# Patient Record
Sex: Female | Born: 1935 | Race: White | Hispanic: No | Marital: Single | Smoking: Never smoker
Health system: Southern US, Community
[De-identification: ages and names within clinical notes are randomized; demographics above are authoritative.]

## PROBLEM LIST (undated history)

## (undated) DIAGNOSIS — J449 Chronic obstructive pulmonary disease, unspecified: Secondary | ICD-10-CM

## (undated) DIAGNOSIS — I4891 Unspecified atrial fibrillation: Secondary | ICD-10-CM

## (undated) HISTORY — PX: REVISION TOTAL HIP ARTHROPLASTY: SHX766

## (undated) HISTORY — PX: REPLACEMENT TOTAL KNEE: SUR1224

---
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
## 1997-09-27 ENCOUNTER — Encounter: Admission: RE | Admit: 1997-09-27 | Discharge: 1997-12-26 | Payer: Self-pay | Admitting: Neurological Surgery

## 1999-04-12 ENCOUNTER — Other Ambulatory Visit: Admission: RE | Admit: 1999-04-12 | Discharge: 1999-04-12 | Payer: Self-pay | Admitting: Obstetrics & Gynecology

## 1999-04-30 ENCOUNTER — Other Ambulatory Visit: Admission: RE | Admit: 1999-04-30 | Discharge: 1999-04-30 | Payer: Self-pay | Admitting: Obstetrics & Gynecology

## 2000-05-05 ENCOUNTER — Encounter: Admission: RE | Admit: 2000-05-05 | Discharge: 2000-05-23 | Payer: Self-pay | Admitting: Family Medicine

## 2001-05-05 ENCOUNTER — Other Ambulatory Visit: Admission: RE | Admit: 2001-05-05 | Discharge: 2001-05-05 | Payer: Self-pay | Admitting: Obstetrics & Gynecology

## 2005-03-28 ENCOUNTER — Other Ambulatory Visit: Admission: RE | Admit: 2005-03-28 | Discharge: 2005-03-28 | Payer: Self-pay | Admitting: Obstetrics & Gynecology

## 2020-02-17 ENCOUNTER — Emergency Department (HOSPITAL_COMMUNITY)
Admission: EM | Admit: 2020-02-17 | Discharge: 2020-02-17 | Disposition: A | Discharge status: Home / Self Care | Payer: Medicare Other | Attending: Emergency Medicine | Admitting: Emergency Medicine

## 2020-02-17 ENCOUNTER — Emergency Department (HOSPITAL_COMMUNITY): Payer: Medicare Other

## 2020-02-17 ENCOUNTER — Encounter (HOSPITAL_COMMUNITY): Payer: Self-pay | Admitting: Emergency Medicine

## 2020-02-17 ENCOUNTER — Other Ambulatory Visit: Payer: Self-pay

## 2020-02-17 DIAGNOSIS — W19XXXA Unspecified fall, initial encounter: Secondary | ICD-10-CM

## 2020-02-17 DIAGNOSIS — Y92009 Unspecified place in unspecified non-institutional (private) residence as the place of occurrence of the external cause: Secondary | ICD-10-CM | POA: Diagnosis not present

## 2020-02-17 DIAGNOSIS — J449 Chronic obstructive pulmonary disease, unspecified: Secondary | ICD-10-CM | POA: Insufficient documentation

## 2020-02-17 DIAGNOSIS — S51812A Laceration without foreign body of left forearm, initial encounter: Secondary | ICD-10-CM | POA: Diagnosis not present

## 2020-02-17 DIAGNOSIS — W0110XA Fall on same level from slipping, tripping and stumbling with subsequent striking against unspecified object, initial encounter: Secondary | ICD-10-CM | POA: Diagnosis not present

## 2020-02-17 DIAGNOSIS — S0181XA Laceration without foreign body of other part of head, initial encounter: Secondary | ICD-10-CM | POA: Insufficient documentation

## 2020-02-17 DIAGNOSIS — R0789 Other chest pain: Secondary | ICD-10-CM | POA: Insufficient documentation

## 2020-02-17 DIAGNOSIS — S0993XA Unspecified injury of face, initial encounter: Secondary | ICD-10-CM | POA: Diagnosis present

## 2020-02-17 HISTORY — DX: Chronic obstructive pulmonary disease, unspecified: J44.9

## 2020-02-17 HISTORY — DX: Unspecified atrial fibrillation: I48.91

## 2020-02-17 LAB — COMPREHENSIVE METABOLIC PANEL
ALT: 16 U/L (ref 0–44)
AST: 26 U/L (ref 15–41)
Albumin: 3.4 g/dL — ABNORMAL LOW (ref 3.5–5.0)
Alkaline Phosphatase: 71 U/L (ref 38–126)
Anion gap: 12 (ref 5–15)
BUN: 35 mg/dL — ABNORMAL HIGH (ref 8–23)
CO2: 21 mmol/L — ABNORMAL LOW (ref 22–32)
Calcium: 9.2 mg/dL (ref 8.9–10.3)
Chloride: 99 mmol/L (ref 98–111)
Creatinine, Ser: 1.39 mg/dL — ABNORMAL HIGH (ref 0.44–1.00)
GFR, Estimated: 26 mL/min — ABNORMAL LOW (ref >60)
Glucose, Bld: 143 mg/dL — ABNORMAL HIGH (ref 70–99)
Potassium: 4.1 mmol/L (ref 3.5–5.1)
Sodium: 132 mmol/L — ABNORMAL LOW (ref 135–145)
Total Bilirubin: 0.6 mg/dL (ref 0.3–1.2)
Total Protein: 6.5 g/dL (ref 6.5–8.1)

## 2020-02-17 LAB — CBC
HCT: 38.5 % (ref 36.0–46.0)
Hemoglobin: 12.6 g/dL (ref 12.0–15.0)
MCH: 33.1 pg (ref 26.0–34.0)
MCHC: 32.7 g/dL (ref 30.0–36.0)
MCV: 101 fL — ABNORMAL HIGH (ref 80.0–100.0)
Platelets: 260 10*3/uL (ref 150–400)
RBC: 3.81 MIL/uL — ABNORMAL LOW (ref 3.87–5.11)
RDW: 13.2 % (ref 11.5–15.5)
WBC: 8 10*3/uL (ref 4.0–10.5)
nRBC: 0 % (ref 0.0–0.2)

## 2020-02-17 LAB — PROTIME-INR
INR: 2.4 — ABNORMAL HIGH (ref 0.8–1.2)
Prothrombin Time: 25.7 seconds — ABNORMAL HIGH (ref 11.4–15.2)

## 2020-02-17 MED ORDER — LIDOCAINE-EPINEPHRINE 1 %-1:100000 IJ SOLN
10.0000 mL | Freq: Once | INTRAMUSCULAR | Status: DC
  Filled 2020-02-17: qty 1

## 2020-02-17 MED ORDER — FENTANYL CITRATE (PF) 100 MCG/2ML IJ SOLN: 25.0000 ug | Freq: Once | INTRAMUSCULAR | Status: DC

## 2020-02-17 MED ORDER — TETANUS-DIPHTH-ACELL PERTUSSIS 5-2.5-18.5 LF-MCG/0.5 IM SUSY: 0.5000 mL | PREFILLED_SYRINGE | Freq: Once | INTRAMUSCULAR | Status: DC

## 2020-02-17 MED ORDER — LIDOCAINE-EPINEPHRINE-TETRACAINE (LET) TOPICAL GEL
3.0000 mL | Freq: Once | TOPICAL | Status: DC
  Filled 2020-02-17: qty 3

## 2020-02-17 NOTE — ED Notes (Signed)
Pt to CT at this time.  Pt remains alert and oriented x's 3

## 2020-02-17 NOTE — Progress Notes (Signed)
   02/17/20 1912  Clinical Encounter Type  Visited With Patient not available  Visit Type Trauma  Referral From Nurse  Consult/Referral To Chaplain   Chaplain responded to Level 2 trauma. Pt being treated. No current need for chaplain. Chaplain remains available as needed.  This note was prepared by Chaplain Resident, Tacy Learn, MDiv. Chaplain remains available as needed through the on-call pager: [phone].

## 2020-02-17 NOTE — Progress Notes (Signed)
Orthopedic Tech Progress Note Patient Details:  Elizabeth Wise 03/18/1873 [id number] Level 2 trauma Patient ID: Melvia Heaps Doe, female   DOB: 03/18/1873, 86 y.o.   MRN: [id number]   Michelle Piper 02/17/2020, 7:33 PM

## 2020-02-17 NOTE — ED Notes (Signed)
Pt remains in CT at this time.  

## 2020-02-17 NOTE — ED Provider Notes (Signed)
[facility] EMERGENCY DEPARTMENT Provider Note   CSN: [id number] Arrival date & time: 02/17/20  1929     History CC: fall  Elizabeth Wise is a 86 y.o. female with history of A. fib presents after a ground-level fall at home.  Per patient, she tripped over something on the floor and fell striking her head on a table.  She states she is on warfarin for A. fib.  No LOC.  Has anterior left chest wall pain and right index finger pain.  GCS 15, ABCs intact.  The history is provided by the patient and the EMS personnel.  Trauma Mechanism of injury: fall Injury location: face Injury location detail: face Incident location: home Time since incident: Just prior to arrival. Arrived directly from scene: yes   Fall:      Fall occurred: Tripped over furniture.      Height of fall: Ground-level      Point of impact: head      Entrapped after fall: no  EMS/PTA data:      Loss of consciousness: no      Amnesic to event: no  Current symptoms:      Associated symptoms:            Reports chest pain (Left chest wall pain).            Denies abdominal pain, back pain, difficulty breathing, headache, loss of consciousness, nausea, neck pain and seizures.   Relevant PMH:      Medical risk factors:            AFib      Pharmacological risk factors:            Anticoagulation therapy.       Tetanus status: UTD      Past Medical History:  Diagnosis Date  . Atrial fibrillation (HCC)   . COPD (chronic obstructive pulmonary disease) (HCC)     There are no problems to display for this patient.   Past Surgical History:  Procedure Laterality Date  . REPLACEMENT TOTAL KNEE    . REVISION TOTAL HIP ARTHROPLASTY       OB History   No obstetric history on file.     No family history on file.  Social History   Tobacco Use  . Smoking status: Never Smoker  . Smokeless tobacco: Never Used  Substance Use Topics  . Alcohol use: Yes    Comment: Wine  . Drug  use: Never    Home Medications Prior to Admission medications   Not on File    Allergies    Patient has no allergy information on record.  Review of Systems   Review of Systems  Cardiovascular: Positive for chest pain (Left chest wall pain).  Gastrointestinal: Negative for abdominal pain and nausea.  Musculoskeletal: Negative for back pain and neck pain.  Neurological: Negative for seizures, loss of consciousness and headaches.  All other systems reviewed and are negative.   Physical Exam Updated Vital Signs BP (!) 144/93   Pulse 76   Temp 98 F (36.7 C) (Temporal)   Resp 15   Ht 5\' 2"  (1.575 m)   Wt 53.5 kg   LMP  (Exact Date)   SpO2 99%   BMI 21.58 kg/m     Physical Exam  Constitutional  Awake, alert  Head   4 cm linear hemostatic right forehead laceration  No skull depressions or lacerations  ENT  PERRL, 12mm  No conjunctival  hemorrhage  No periorbital ecchymoses, [place] Eyes, or Battle Sign bilaterally  Ears atraumatic  No nasal septal deviation or hematoma  Mouth and tongue atraumatic  Trachea midline   Neck  No C collar in place  Neck supple  Chest  Clavicles atraumatic  Clavicles stable to anterior compression without crepitus  Left anterior chest wall point tenderness around rib 10-12  Chest wall with symmetric expansion  Chest wall stable to anterior and lateral compression without crepitus  Respiratory  Effort normal  CTAB  No respiratory distress  CV   tachycardic, irregular to the 110s  DP and radial pulses 2+ and equal bilaterally  Abdomen  Soft  Non-tender  Non-distended  No peritonitis  No abrasions/contusions  MSK  Swelling and tenderness of right index finger with difficulty with flexion  No obvious deformity  ROM appropriate  Pelvis stable to lateral compression  Skin  Warm  Dry  Skin tears to left forearm x2  Neuro  Moving all extremities  GCS 15    ED Results / Procedures / Treatments    Labs (all labs ordered are listed, but only abnormal results are displayed) Labs Reviewed  COMPREHENSIVE METABOLIC PANEL - Abnormal; Notable for the following components:      Result Value   Sodium 132 (*)    CO2 21 (*)    Glucose, Bld 143 (*)    BUN 35 (*)    Creatinine, Ser 1.39 (*)    Albumin 3.4 (*)    GFR, Estimated 26 (*)    All other components within normal limits  CBC - Abnormal; Notable for the following components:   RBC 3.81 (*)    MCV 101.0 (*)    All other components within normal limits  PROTIME-INR - Abnormal; Notable for the following components:   Prothrombin Time 25.7 (*)    INR 2.4 (*)    All other components within normal limits  SAMPLE TO BLOOD BANK    EKG EKG Interpretation  Date/Time:  Thursday February 17 2020 19:40:15 EST Ventricular Rate:  125 PR Interval:    QRS Duration: 91 QT Interval:  339 QTC Calculation: 489 R Axis:   -78 Text Interpretation: Atrial fibrillation Left axis deviation Anteroseptal infarct, age indeterminate ST elevation, consider inferior injury Confirmed by Virgina Norfolk [phone]) on 02/17/2020 8:10:36 PM   Radiology DG Ribs Unilateral Left  Result Date: 02/17/2020 CLINICAL DATA:  Left rib pain after fall EXAM: LEFT RIBS - 2 VIEW COMPARISON:  02/17/2020 FINDINGS: Frontal and oblique views of the left thoracic cage are obtained. There are no acute displaced fractures. Left chest is clear. No effusion or pneumothorax. IMPRESSION: 1. No acute bony abnormalities. Electronically Signed   By: Sharlet Salina M.D.   On: 02/17/2020 21:04   CT HEAD WO CONTRAST  Result Date: 02/17/2020 CLINICAL DATA:  Larey Seat, anticoagulated, neck pain EXAM: CT HEAD WITHOUT CONTRAST TECHNIQUE: Contiguous axial images were obtained from the base of the skull through the vertex without intravenous contrast. COMPARISON:  None. FINDINGS: Brain: Hypodensities within the inferior right frontal lobe, bilateral periventricular white matter, and left basal ganglia  consistent with chronic ischemic changes. No signs of acute infarct or hemorrhage. Lateral ventricles and remaining midline structures are unremarkable. No acute extra-axial fluid collections. No mass effect. Vascular: Atherosclerosis throughout the internal carotid arteries. No hyperdense vessel. Skull: Normal. Negative for fracture or focal lesion. Laceration right frontal scalp. Sinuses/Orbits: Polypoid mucosal thickening right maxillary sinus. Remaining sinuses are clear. Other: None. IMPRESSION: 1. Chronic ischemic  changes as above. No acute intracranial process. Electronically Signed   By: Sharlet Salina M.D.   On: 02/17/2020 20:18   CT CERVICAL SPINE WO CONTRAST  Result Date: 02/17/2020 CLINICAL DATA:  Larey Seat, neck pain EXAM: CT CERVICAL SPINE WITHOUT CONTRAST TECHNIQUE: Multidetector CT imaging of the cervical spine was performed without intravenous contrast. Multiplanar CT image reconstructions were also generated. COMPARISON:  None. FINDINGS: Alignment: There is grade 1 anterolisthesis of C4 on C5. Mild retrolisthesis of C6 relative to C7. These changes are both likely related to congenital C5/C6 fusion and associated spondylosis. Skull base and vertebrae: No acute fracture. No primary bone lesion or focal pathologic process. Soft tissues and spinal canal: No prevertebral fluid or swelling. No visible canal hematoma. Disc levels: Severe hypertrophic changes are seen at the C1/C2 interface, eccentric to the right. There is bony fusion at C5/C6. Significant spondylosis is seen at the adjacent levels of C4/C5 and C6/C7. There is diffuse facet hypertrophy at all levels. Significant bilateral neural foraminal encroachment is seen at C3-4, C4-5, and C6-7. Upper chest: Airway is patent.  Lung apices are clear. Other: Reconstructed images demonstrate no additional findings. IMPRESSION: 1. No acute cervical spine fracture. 2. Extensive multilevel cervical degenerative changes. Electronically Signed   By: Sharlet Salina M.D.   On: 02/17/2020 20:24   DG Chest Portable 1 View  Result Date: 02/17/2020 CLINICAL DATA:  Larey Seat, anticoagulated EXAM: PORTABLE CHEST 1 VIEW COMPARISON:  None. FINDINGS: Single frontal view of the chest demonstrates an unremarkable cardiac silhouette. No airspace disease, effusion, or pneumothorax. No acute bony abnormalities. Severe bilateral shoulder osteoarthritis. IMPRESSION: 1. No acute intrathoracic process. Electronically Signed   By: Sharlet Salina M.D.   On: 02/17/2020 19:56   DG Hand Complete Right  Result Date: 02/17/2020 CLINICAL DATA:  Larey Seat, swelling, anticoagulated EXAM: RIGHT HAND - COMPLETE 3+ VIEW COMPARISON:  None. FINDINGS: Frontal, oblique, and lateral views of the right hand are obtained. No acute displaced fractures. There is diffuse osteoarthritis throughout the metacarpophalangeal and interphalangeal joints. Slight subluxation at the first metacarpophalangeal joint. Dorsal subluxation of the ulna at the distal radioulnar joint, age indeterminate. Soft tissues are unremarkable. IMPRESSION: 1. Severe dorsal subluxation of the ulna at the distal radioulnar joint, of uncertain acuity. No associated fracture. 2. Diffuse osteoarthritis. Electronically Signed   By: Sharlet Salina M.D.   On: 02/17/2020 19:59    Procedures Procedures (including critical care time)  Medications Ordered in ED Medications  fentaNYL (SUBLIMAZE) injection 25 mcg (has no administration in time range)  Tdap (BOOSTRIX) injection 0.5 mL (has no administration in time range)  lidocaine-EPINEPHrine-tetracaine (LET) topical gel (has no administration in time range)  lidocaine-EPINEPHrine (XYLOCAINE W/EPI) 1 %-1:100000 (with pres) injection 10 mL (has no administration in time range)    ED Course  I have reviewed the triage vital signs and the nursing notes.  Pertinent labs & imaging results that were available during my care of the patient were reviewed by me and considered in my medical  decision making (see chart for details).    MDM Rules/Calculators/A&P                          Appollonia Klee is a 86 y.o. female with significant PMHx of A. fib on warfarin who presented to the ED by EMS as an leveled trauma after mechanical ground-level fall.  Upon arrival of the patient, EMS provided pertinent history and exam findings. The patient was transferred  over to the trauma bed. ABCs intact as exam above. GCS 15. Once IV access was placed and/or confirmed, the secondary exam was performed. Pertinent physical exam findings include linear laceration to right forehead, skin tears to left forearm, right index finger pain, left anterior chest point tenderness. Portable XRs were performed at the bedside. eFAST exam was not performed. The patient was then prepared and sent to the CT for trauma scans. Labs obtained and demonstrated INR within appropriate range (2.4), bicarb 21, sodium 132, BUN 35, creatinine 1.39, CBC unremarkable.  Trauma scans were performed and results are above. Significant findings include no rib fractures, CT head and C-spine unremarkable, right ulnar dislocation.  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  25 mcg IV fentanyl and let gel ordered but never given.  Intradermal lidocaine injection given by me at bedside for lack repair as above.  Tdap up-to-date per patient so was not updated.  On reassessment, patient had bleeding from her scalp wound that had previously been hemostatic.  Venous oozing and doubt arterial injury.  Minimal relief with pressure so injected lidocaine/epi with hemostasis.  Copiously irrigated and sutured at bedside.  Signs or symptoms of infection that would warrant immediate return were discussed with the patient and her son who now is at bedside.  Also discussed seeing the  physician at her facility, going to her PCP, going to urgent care, or coming to the ED for suture removal in about 5 days.  Additionally, patient in A. fib with HR intermittently up to the 140s.  This improved with pain control (lidocaine) and spontaneously.  Patient has a history of A. fib and is taking metoprolol and warfarin.  On reassessment just prior to discharge, patient's HR in the 90s, in A. fib.  Normotensive.  Do not feel that patient requires medications in ED for A. fib with RVR as she is now rate controlled below 100.  No rib fracture seen on left rib XR.  At this time, do not feel that CT chest is indicated given low concern for multiple significant rib fractures.  Discussed using incentive spirometry at home, patient and son voiced understanding and agreed with this plan.  Reassessed hand exam just prior to discharge and patient's right hand neurovascular intact.  Right index finger tender but she has full flexion/extension at MCP, PIP, and DIP joints; thus, doubt tendon this injury.  Wound care discussed.  Strict return precautions given.  Also encouraged her to follow-up with her PCP if she had any persistent or worsening pain.  Encouraged her to follow-up with her PCP on an outpatient basis. Questions were answered. Patient discharged in stable condition.   The plan for this patient was discussed with Dr. Lockie Molauratolo, who voiced agreement and who oversaw evaluation and treatment of this patient.   Final Clinical Impression(s) / ED Diagnoses Final diagnoses:  Fall, initial encounter    Rx / DC Orders ED Discharge Orders    None       Shanteria Laye, MD 02/18/20 0119    Virgina Norfolkuratolo, Adam, DO 02/24/20 2301

## 2020-02-17 NOTE — Discharge Instructions (Addendum)
The stitches will need to be removed in about 5 days.  Make sure to use your incentive spirometer (breathing device) 10 times every hour for the next 2 weeks or until your chest pain improves.  Take 1000mg  Tylenol every 6 hours as needed for pain.

## 2020-02-18 NOTE — ED Provider Notes (Signed)
I have personally seen and examined the patient. I have reviewed the documentation on PMH/FH/Soc Hx. I have discussed the plan of care with the resident and patient.  I have reviewed and agree with the resident's documentation. Please see associated encounter note.  Briefly, the patient is a 86 y.o. female here with fall on blood thinners.  Laceration to the right side of her forehead.  CT imaging unremarkable.  Extremity x-ray is unremarkable.  Neurologically patient is intact.  No complaints otherwise.  No hip pain.  Discharged back to facility.  Mechanical fall.    EKG Interpretation  Date/Time:  Thursday February 17 2020 19:40:15 EST Ventricular Rate:  125 PR Interval:    QRS Duration: 91 QT Interval:  339 QTC Calculation: 489 R Axis:   -78 Text Interpretation: Atrial fibrillation Left axis deviation Anteroseptal infarct, age indeterminate ST elevation, consider inferior injury Confirmed by Virgina Norfolk [id number]) on 02/17/2020 8:10:36 PM         Virgina Norfolk, DO 02/18/20 0101

## 2020-04-06 ENCOUNTER — Emergency Department (HOSPITAL_BASED_OUTPATIENT_CLINIC_OR_DEPARTMENT_OTHER)
Admission: EM | Admit: 2020-04-06 | Discharge: 2020-04-06 | Disposition: A | Discharge status: Home / Self Care | Payer: Medicare Other | Attending: Emergency Medicine | Admitting: Emergency Medicine

## 2020-04-06 ENCOUNTER — Other Ambulatory Visit: Payer: Self-pay

## 2020-04-06 ENCOUNTER — Emergency Department (HOSPITAL_BASED_OUTPATIENT_CLINIC_OR_DEPARTMENT_OTHER): Payer: Medicare Other

## 2020-04-06 DIAGNOSIS — S0990XA Unspecified injury of head, initial encounter: Secondary | ICD-10-CM | POA: Diagnosis present

## 2020-04-06 DIAGNOSIS — Z96659 Presence of unspecified artificial knee joint: Secondary | ICD-10-CM | POA: Diagnosis not present

## 2020-04-06 DIAGNOSIS — Z7901 Long term (current) use of anticoagulants: Secondary | ICD-10-CM | POA: Diagnosis not present

## 2020-04-06 DIAGNOSIS — S40022A Contusion of left upper arm, initial encounter: Secondary | ICD-10-CM | POA: Diagnosis not present

## 2020-04-06 DIAGNOSIS — S0003XA Contusion of scalp, initial encounter: Secondary | ICD-10-CM | POA: Diagnosis not present

## 2020-04-06 DIAGNOSIS — J449 Chronic obstructive pulmonary disease, unspecified: Secondary | ICD-10-CM | POA: Diagnosis not present

## 2020-04-06 DIAGNOSIS — I4891 Unspecified atrial fibrillation: Secondary | ICD-10-CM | POA: Diagnosis not present

## 2020-04-06 DIAGNOSIS — W01198A Fall on same level from slipping, tripping and stumbling with subsequent striking against other object, initial encounter: Secondary | ICD-10-CM | POA: Diagnosis not present

## 2020-04-06 LAB — CBC WITH DIFFERENTIAL/PLATELET
Abs Immature Granulocytes: 0.02 10*3/uL (ref 0.00–0.07)
Basophils Absolute: 0.1 10*3/uL (ref 0.0–0.1)
Basophils Relative: 1 %
Eosinophils Absolute: 0.1 10*3/uL (ref 0.0–0.5)
Eosinophils Relative: 1 %
HCT: 38.1 % (ref 36.0–46.0)
Hemoglobin: 13.2 g/dL (ref 12.0–15.0)
Immature Granulocytes: 0 %
Lymphocytes Relative: 29 %
Lymphs Abs: 2.5 10*3/uL (ref 0.7–4.0)
MCH: 34.6 pg — ABNORMAL HIGH (ref 26.0–34.0)
MCHC: 34.6 g/dL (ref 30.0–36.0)
MCV: 100 fL (ref 80.0–100.0)
Monocytes Absolute: 0.9 10*3/uL (ref 0.1–1.0)
Monocytes Relative: 11 %
Neutro Abs: 5 10*3/uL (ref 1.7–7.7)
Neutrophils Relative %: 58 %
Platelets: 261 10*3/uL (ref 150–400)
RBC: 3.81 MIL/uL — ABNORMAL LOW (ref 3.87–5.11)
RDW: 13.2 % (ref 11.5–15.5)
WBC: 8.5 10*3/uL (ref 4.0–10.5)
nRBC: 0 % (ref 0.0–0.2)

## 2020-04-06 LAB — COMPREHENSIVE METABOLIC PANEL
ALT: 19 U/L (ref 0–44)
AST: 30 U/L (ref 15–41)
Albumin: 3.8 g/dL (ref 3.5–5.0)
Alkaline Phosphatase: 78 U/L (ref 38–126)
Anion gap: 11 (ref 5–15)
BUN: 34 mg/dL — ABNORMAL HIGH (ref 8–23)
CO2: 25 mmol/L (ref 22–32)
Calcium: 9.7 mg/dL (ref 8.9–10.3)
Chloride: 100 mmol/L (ref 98–111)
Creatinine, Ser: 1.06 mg/dL — ABNORMAL HIGH (ref 0.44–1.00)
GFR, Estimated: 49 mL/min — ABNORMAL LOW (ref >60)
Glucose, Bld: 124 mg/dL — ABNORMAL HIGH (ref 70–99)
Potassium: 3.9 mmol/L (ref 3.5–5.1)
Sodium: 136 mmol/L (ref 135–145)
Total Bilirubin: 0.4 mg/dL (ref 0.3–1.2)
Total Protein: 7.2 g/dL (ref 6.5–8.1)

## 2020-04-06 LAB — PROTIME-INR
INR: 1.8 — ABNORMAL HIGH (ref 0.8–1.2)
Prothrombin Time: 20.5 seconds — ABNORMAL HIGH (ref 11.4–15.2)

## 2020-04-06 LAB — TROPONIN I (HIGH SENSITIVITY)
Troponin I (High Sensitivity): 12 ng/L (ref <18)
Troponin I (High Sensitivity): 14 ng/L (ref <18)

## 2020-04-06 MED ORDER — DILTIAZEM HCL ER COATED BEADS 120 MG PO CP24: 120.0000 mg | ORAL_CAPSULE | Freq: Every day | ORAL | 0 refills | Status: AC

## 2020-04-06 MED ORDER — SODIUM CHLORIDE 0.9 % IV BOLUS
500.0000 mL | Freq: Once | INTRAVENOUS | Status: AC
  Administered 2020-04-06: 500 mL via INTRAVENOUS

## 2020-04-06 MED ORDER — DILTIAZEM HCL ER COATED BEADS 120 MG PO CP24
120.0000 mg | ORAL_CAPSULE | Freq: Once | ORAL | Status: AC
  Administered 2020-04-06: 120 mg via ORAL
  Filled 2020-04-06: qty 1

## 2020-04-06 MED ORDER — ACETAMINOPHEN 325 MG PO TABS
650.0000 mg | ORAL_TABLET | Freq: Once | ORAL | Status: AC
  Administered 2020-04-06: 650 mg via ORAL
  Filled 2020-04-06: qty 2

## 2020-04-06 NOTE — Discharge Instructions (Signed)
Continue taking your Coumadin as prescribed.  Your heart rate does go up when you try and walk around so please take Cardizem 120 mg CD daily.  You need to see a heart doctor for follow-up  Return to ER if you have passed out, chest pain, trouble breathing, palpitations

## 2020-04-06 NOTE — ED Provider Notes (Addendum)
[facility] EMERGENCY DEPARTMENT Provider Note   CSN: [id number] Arrival date & time: 04/06/20  1601     History Chief Complaint  Patient presents with  . Fall    Ground level fall to carpet. Struck her head on door and her left arm on the floor. Denies loss of consciousness or dizziness.    Elizabeth Wise is a 87 y.o. female history of A. fib on Coumadin, COPD here presenting with fall.  Patient states that she went to the doors and somebody rang the doorbell and turned around and did not quite remember what happened and fell and hit her head.  She also has left humerus pain as well.  Patient is unclear if she passed out or not.  Patient states that she takes Coumadin for A. fib but denies any palpitations or chest pain.   The history is provided by the patient.       Past Medical History:  Diagnosis Date  . Atrial fibrillation (HCC)   . COPD (chronic obstructive pulmonary disease) (HCC)     There are no problems to display for this patient.   Past Surgical History:  Procedure Laterality Date  . REPLACEMENT TOTAL KNEE    . REVISION TOTAL HIP ARTHROPLASTY       OB History   No obstetric history on file.     No family history on file.  Social History   Tobacco Use  . Smoking status: Never Smoker  . Smokeless tobacco: Never Used  Substance Use Topics  . Alcohol use: Yes    Comment: Wine  . Drug use: Never    Home Medications Prior to Admission medications   Medication Sig Start Date End Date Taking? Authorizing Provider  diltiazem (CARDIZEM CD) 120 MG 24 hr capsule Take 1 capsule (120 mg total) by mouth daily. 04/06/20  Yes Charlynne Pander, MD    Allergies    Ambien [zolpidem] and Doxycycline  Review of Systems   Review of Systems  Musculoskeletal:       L humerus pain   Neurological: Positive for headaches.  All other systems reviewed and are negative.   Physical Exam Updated Vital Signs BP (!) 156/96 (BP Location: Right Arm)    Pulse 70   Temp 98.6 F (37 C) (Oral)   Resp 20   Ht 5\' 3"  (1.6 m)   Wt 53.5 kg   SpO2 96%   BMI 20.90 kg/m   Physical Exam Vitals and nursing note reviewed.  Constitutional:      Comments: Well-appearing for age  HENT:     Head: Normocephalic.     Comments: Small posterior scalp hematoma    Nose: Nose normal.     Mouth/Throat:     Mouth: Mucous membranes are moist.  Eyes:     Extraocular Movements: Extraocular movements intact.     Pupils: Pupils are equal, round, and reactive to light.  Cardiovascular:     Rate and Rhythm: Normal rate and regular rhythm.     Pulses: Normal pulses.     Heart sounds: Normal heart sounds.  Pulmonary:     Effort: Pulmonary effort is normal.     Breath sounds: Normal breath sounds.  Abdominal:     General: Abdomen is flat.     Palpations: Abdomen is soft.  Musculoskeletal:     Cervical back: Normal range of motion and neck supple.     Comments: Bruising on the left distal humerus with no obvious bony  deformity.  No obvious left forearm deformity.  Normal range of motion bilateral hips.  No other obvious extremity trauma and no spinal tenderness  Skin:    General: Skin is warm.     Capillary Refill: Capillary refill takes less than 2 seconds.  Neurological:     General: No focal deficit present.     Mental Status: She is oriented to person, place, and time.  Psychiatric:        Mood and Affect: Mood normal.        Behavior: Behavior normal.     ED Results / Procedures / Treatments   Labs (all labs ordered are listed, but only abnormal results are displayed) Labs Reviewed  CBC WITH DIFFERENTIAL/PLATELET - Abnormal; Notable for the following components:      Result Value   RBC 3.81 (*)    MCH 34.6 (*)    All other components within normal limits  COMPREHENSIVE METABOLIC PANEL - Abnormal; Notable for the following components:   Glucose, Bld 124 (*)    BUN 34 (*)    Creatinine, Ser 1.06 (*)    GFR, Estimated 49 (*)    All other  components within normal limits  PROTIME-INR - Abnormal; Notable for the following components:   Prothrombin Time 20.5 (*)    INR 1.8 (*)    All other components within normal limits  TROPONIN I (HIGH SENSITIVITY)  TROPONIN I (HIGH SENSITIVITY)    EKG None ED ECG REPORT   Date: 05/22/2020  Rate: 102  Rhythm: atrial fibrillation  QRS Axis: normal  Intervals: normal  ST/T Wave abnormalities: nonspecific ST changes  Conduction Disutrbances:none  Narrative Interpretation:   Old EKG Reviewed: unchanged  I have personally reviewed the EKG tracing and agree with the computerized printout as noted.    Radiology No results found.  Procedures Procedures (including critical care time)  Medications Ordered in ED Medications  acetaminophen (TYLENOL) tablet 650 mg (650 mg Oral Given 04/06/20 1639)  diltiazem (CARDIZEM CD) 24 hr capsule 120 mg (120 mg Oral Given 04/06/20 1737)  sodium chloride 0.9 % bolus 500 mL (0 mLs Intravenous Stopped 04/06/20 1851)    ED Course  I have reviewed the triage vital signs and the nursing notes.  Pertinent labs & imaging results that were available during my care of the patient were reviewed by me and considered in my medical decision making (see chart for details).    MDM Rules/Calculators/A&P                         Elizabeth Wise is a 87 y.o. female here presenting with fall.  Likely mechanical fall but she did hit her head. She has a history of A. fib on Coumadin so we'll check her INR and CBC and CMP.  We'll also get CT head and neck and x-rays.  7:52 PM Patient CT head and neck unremarkable.  INR is 1.8.  Troponin negative x2.  Her heart rate briefly goes up to 130s.  Patient has a history of atrial fibrillation but is not on any rate control agent.  Patient was given Cardizem 120 mg CD and heart rate remained controlled in the low 100s. I wonder if she went up to rapid A. fib and that is causing her near syncopal event.  We will start her  on Cardizem and will have her follow-up with cardiology outpatient  Final Clinical Impression(s) / ED Diagnoses Final diagnoses:  Atrial fibrillation,  unspecified type [facility])  Injury of head, initial encounter    Rx / DC Orders ED Discharge Orders         Ordered    diltiazem (CARDIZEM CD) 120 MG 24 hr capsule  Daily        04/06/20 1956           Charlynne Pander, MD 04/06/20 Rushie Goltz    Charlynne Pander, MD 05/22/20 [phone]

## 2020-04-06 NOTE — ED Notes (Signed)
Pt. Reports her neighbor brought the mail to her and she went to the door and she stumbled and fell at the door and has no idea what happened.

## 2021-07-01 IMAGING — DX DG PELVIS 1-2V
1 series · 1 of 1 positions shown · non-contrast
Comparison: None.

CLINICAL DATA: Fall

EXAM:
PELVIS - 1-2 VIEW

[pelvis ap]
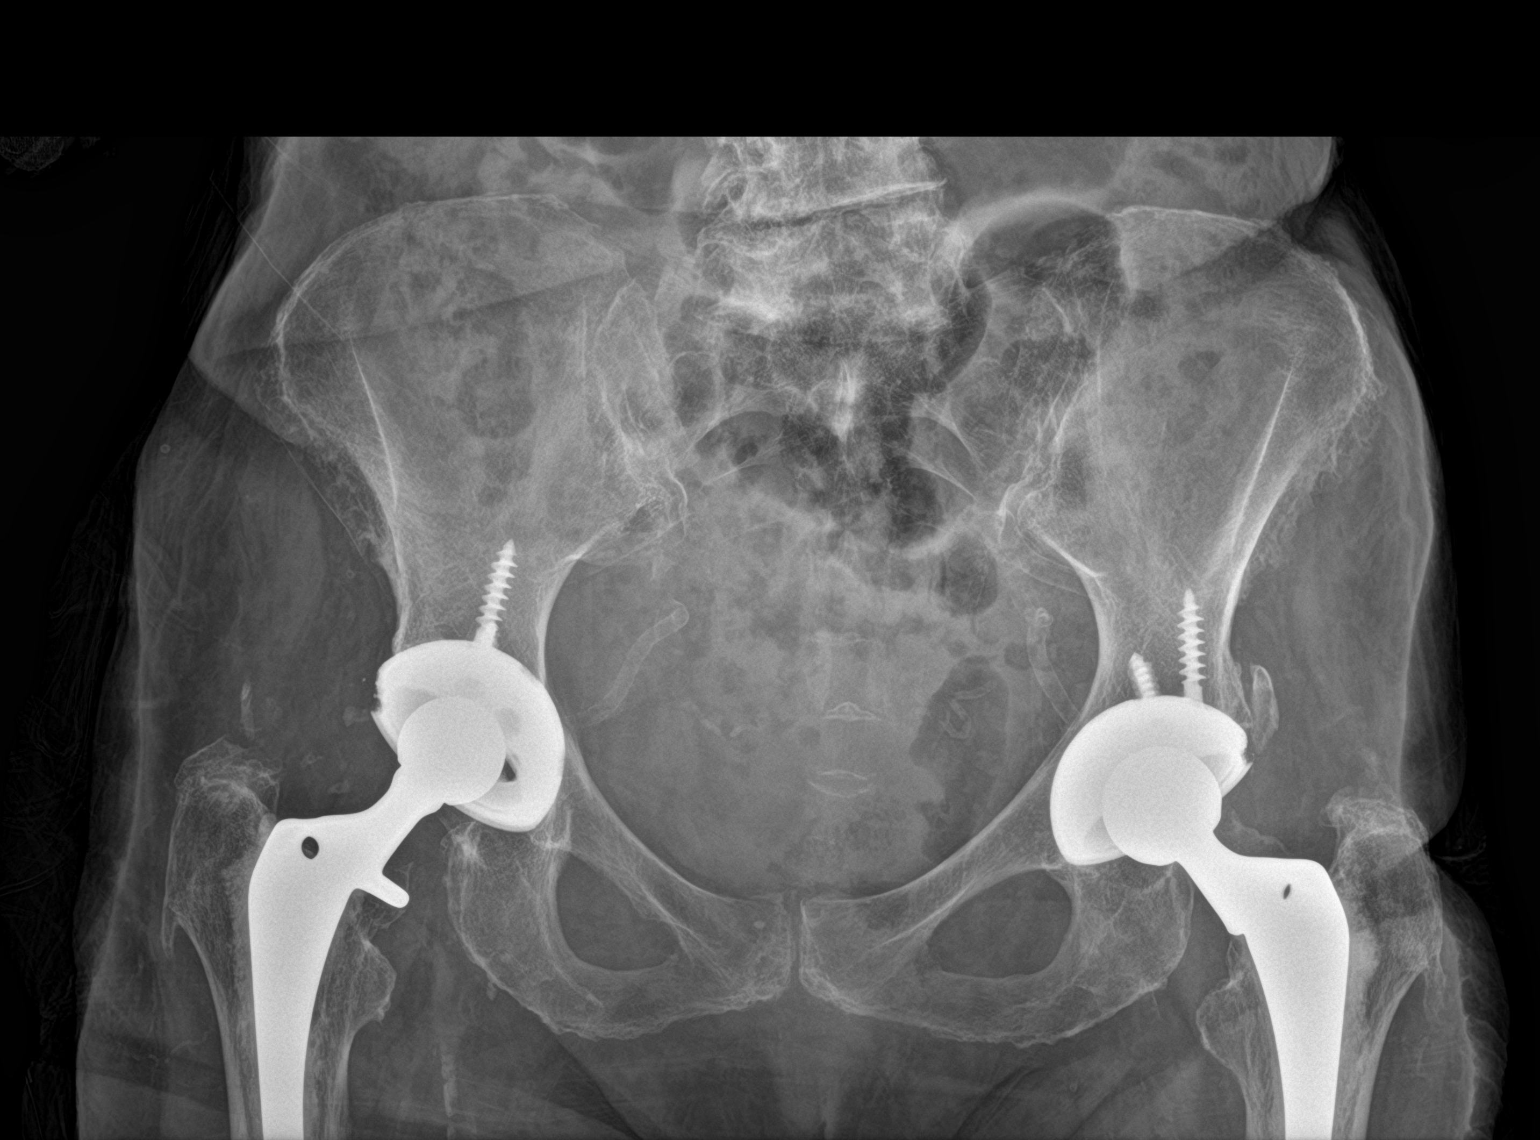

[1 of 1 positions shown; findings below may reference images not displayed]

FINDINGS: SI joints are non widened. Pubic symphysis and rami are intact.
Bilateral hip replacements with intact hardware and normal
alignment. No acute displaced fracture is visualized
IMPRESSION: No acute osseous abnormality.  Bilateral hip replacements
